# Patient Record
Sex: Male | Born: 1990 | Race: White | Hispanic: No | Marital: Single
Health system: Southern US, Community
[De-identification: ages and names within clinical notes are randomized; demographics above are authoritative.]

---
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
## 2011-01-02 LAB — CBC
HCT: 41.8 % (ref 36.0–49.0)
Hemoglobin: 13.9 g/dL (ref 12.0–16.0)
RDW: 13.7 % (ref 11.4–15.5)
WBC: 11.9 10*3/uL (ref 4.5–13.5)

## 2011-01-02 LAB — POCT I-STAT, CHEM 8
BUN: 10 mg/dL (ref 6–23)
Chloride: 105 mEq/L (ref 96–112)
Glucose, Bld: 111 mg/dL — ABNORMAL HIGH (ref 70–99)
HCT: 41 % (ref 36.0–49.0)
Potassium: 3.9 mEq/L (ref 3.5–5.1)

## 2011-01-02 LAB — DIFFERENTIAL
Basophils Absolute: 0.1 10*3/uL (ref 0.0–0.1)
Lymphocytes Relative: 22 % — ABNORMAL LOW (ref 24–48)
Lymphs Abs: 2.6 10*3/uL (ref 1.1–4.8)
Monocytes Absolute: 1.8 10*3/uL — ABNORMAL HIGH (ref 0.2–1.2)
Neutro Abs: 7.4 10*3/uL (ref 1.7–8.0)

## 2011-01-02 LAB — MONONUCLEOSIS SCREEN: Mono Screen: POSITIVE — AB

## 2011-01-02 LAB — RAPID STREP SCREEN (MED CTR MEBANE ONLY): Streptococcus, Group A Screen (Direct): NEGATIVE

## 2011-01-02 LAB — STREP A DNA PROBE: Group A Strep Probe: NEGATIVE

## 2011-01-02 LAB — POCT RAPID STREP A (OFFICE): Streptococcus, Group A Screen (Direct): NEGATIVE

## 2011-01-08 LAB — RAPID STREP SCREEN (MED CTR MEBANE ONLY): Streptococcus, Group A Screen (Direct): NEGATIVE
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
## 2022-01-20 ENCOUNTER — Emergency Department (HOSPITAL_BASED_OUTPATIENT_CLINIC_OR_DEPARTMENT_OTHER)
Admission: EM | Admit: 2022-01-20 | Discharge: 2022-01-20 | Disposition: A | Discharge status: Home / Self Care | Attending: Emergency Medicine | Admitting: Emergency Medicine

## 2022-01-20 ENCOUNTER — Emergency Department (HOSPITAL_BASED_OUTPATIENT_CLINIC_OR_DEPARTMENT_OTHER)

## 2022-01-20 ENCOUNTER — Encounter (HOSPITAL_BASED_OUTPATIENT_CLINIC_OR_DEPARTMENT_OTHER): Payer: Self-pay | Admitting: Emergency Medicine

## 2022-01-20 ENCOUNTER — Other Ambulatory Visit: Payer: Self-pay

## 2022-01-20 DIAGNOSIS — R002 Palpitations: Secondary | ICD-10-CM | POA: Diagnosis present

## 2022-01-20 DIAGNOSIS — R0682 Tachypnea, not elsewhere classified: Secondary | ICD-10-CM | POA: Diagnosis not present

## 2022-01-20 DIAGNOSIS — R55 Syncope and collapse: Secondary | ICD-10-CM | POA: Insufficient documentation

## 2022-01-20 LAB — CBC WITH DIFFERENTIAL/PLATELET
Abs Immature Granulocytes: 0.01 10*3/uL (ref 0.00–0.07)
Basophils Absolute: 0.1 10*3/uL (ref 0.0–0.1)
Basophils Relative: 1 %
Eosinophils Absolute: 0.1 10*3/uL (ref 0.0–0.5)
Eosinophils Relative: 3 %
HCT: 44.9 % (ref 39.0–52.0)
Hemoglobin: 14.7 g/dL (ref 13.0–17.0)
Immature Granulocytes: 0 %
Lymphocytes Relative: 22 %
Lymphs Abs: 1.2 10*3/uL (ref 0.7–4.0)
MCH: 29.5 pg (ref 26.0–34.0)
MCHC: 32.7 g/dL (ref 30.0–36.0)
MCV: 90.2 fL (ref 80.0–100.0)
Monocytes Absolute: 0.6 10*3/uL (ref 0.1–1.0)
Monocytes Relative: 11 %
Neutro Abs: 3.3 10*3/uL (ref 1.7–7.7)
Neutrophils Relative %: 63 %
Platelets: 250 10*3/uL (ref 150–400)
RBC: 4.98 MIL/uL (ref 4.22–5.81)
RDW: 12.9 % (ref 11.5–15.5)
WBC: 5.2 10*3/uL (ref 4.0–10.5)
nRBC: 0 % (ref 0.0–0.2)

## 2022-01-20 LAB — TSH: TSH: 1.439 u[IU]/mL (ref 0.350–4.500)

## 2022-01-20 LAB — BASIC METABOLIC PANEL
Anion gap: 7 (ref 5–15)
BUN: 14 mg/dL (ref 6–20)
CO2: 26 mmol/L (ref 22–32)
Calcium: 9.5 mg/dL (ref 8.9–10.3)
Chloride: 105 mmol/L (ref 98–111)
Creatinine, Ser: 0.86 mg/dL (ref 0.61–1.24)
GFR, Estimated: >60 mL/min (ref >60)
Glucose, Bld: 96 mg/dL (ref 70–99)
Potassium: 4.1 mmol/L (ref 3.5–5.1)
Sodium: 138 mmol/L (ref 135–145)

## 2022-01-20 LAB — T4, FREE: Free T4: 0.91 ng/dL (ref 0.61–1.12)

## 2022-01-20 LAB — MAGNESIUM: Magnesium: 2.3 mg/dL (ref 1.7–2.4)

## 2022-01-20 NOTE — ED Provider Notes (Signed)
?[facility]PT ?Provider Note ? ? ?CSN: [id number] ?Arrival date & time: 01/20/22  1151 ? ?  ? ?History ? ?Chief Complaint  ?Patient presents with  ? Palpitations  ?  Near syncope  ? ? ?Bobby Hicks is a 31 y.o. male. ? ?HPI ? ? 31 year old male presenting to the ED with palpitations, tachypnea, and near syncope.  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  The patient states that he has had episodes of near syncope and syncope previously with associated palpitations, tachypnea and lightheadedness.  During these episodes, he feels paresthesias in his left hand.  Symptoms came on today suddenly.  He denies any chest pain.  He felt mildly diaphoretic.  Symptoms have since resolved since presenting to the emergency department.  He states that he has previously had an echocardiogram through the [employer] [facility] and has had a Holter monitor placed for 7 days which got no episodes of palpitations because he did not have any while he wore the monitor. ? ?Home Medications ?Prior to Admission medications   ?Not on File  ?   ? ?Allergies    ?Patient has no known allergies.   ? ?Review of Systems   ?Review of Systems  ?Respiratory:  Positive for shortness of breath.   ?Cardiovascular:  Positive for palpitations.  ?Neurological:  Positive for light-headedness.  ?All other systems reviewed and are negative. ? ?Physical Exam ?Updated Vital Signs ?BP 139/83 (BP Location: Right Arm)   Pulse 65   Temp 97.8 ?F (36.6 ?C)   Resp (!) 22   Wt 90.7 kg   SpO2 99%  ?Physical Exam ?Vitals and nursing note reviewed.  ?Constitutional:   ?   General: He is not in acute distress. ?   Appearance: He is well-developed.  ?HENT:  ?   Head: Normocephalic and atraumatic.  ?   Mouth/Throat:  ?   Mouth: Mucous membranes are moist.  ?Eyes:  ?   Conjunctiva/sclera: Conjunctivae normal.  ?Cardiovascular:  ?    Rate and Rhythm: Normal rate and regular rhythm.  ?   Pulses: Normal pulses.  ?   Heart sounds: No murmur heard. ?Pulmonary:  ?   Effort: Pulmonary effort is normal. No respiratory distress.  ?   Breath sounds: Normal breath sounds.  ?Abdominal:  ?   Palpations: Abdomen is soft.  ?   Tenderness: There is no abdominal tenderness.  ?Musculoskeletal:     ?   General: No swelling.  ?   Cervical back: Neck supple.  ?   Right lower leg: No edema.  ?   Left lower leg: No edema.  ?Skin: ?   General: Skin is warm and dry.  ?   Capillary Refill: Capillary refill takes less than 2 seconds.  ?Neurological:  ?   General: No focal deficit present.  ?   Mental Status: He is alert and oriented to person, place, and time.  ?   Cranial Nerves: No cranial nerve deficit.  ?   Sensory: No sensory deficit.  ?   Motor: No weakness.  ?   Gait: Gait normal.  ?Psychiatric:     ?   Mood and Affect: Mood is anxious.  ? ? ?ED Results / Procedures / Treatments   ?Labs ?(all labs ordered are listed, but only abnormal results are displayed) ?Labs Reviewed  ?BASIC METABOLIC PANEL  ?MAGNESIUM  ?CBC  WITH DIFFERENTIAL/PLATELET  ?TSH  ?T4, FREE  ?CBG MONITORING, ED  ? ? ?EKG ?EKG Interpretation ? ?Date/Time:  Sunday January 20 2022 12:09:33 EDT ?Ventricular Rate:  70 ?PR Interval:  158 ?QRS Duration: 106 ?QT Interval:  404 ?QTC Calculation: 436 ?R Axis:   -27 ?Text Interpretation: Normal sinus rhythm with sinus arrhythmia Incomplete right bundle branch block Borderline ECG No previous ECGs available Confirmed by Ernie Avena ([id number]) on 01/20/2022 1:15:07 PM ? ?Radiology ?No results found. ? ?Procedures ?Procedures  ? ? ?Medications Ordered in ED ?Medications - No data to display ? ?ED Course/ Medical Decision Making/ A&P ?  ?                        ?Medical Decision Making ?Amount and/or Complexity of Data Reviewed ?Labs: ordered. ?Radiology: ordered. ? ? ? 31 year old male presenting to the ED with palpitations, tachypnea, and near syncope.  The patient  is active duty in the [employer] and has had previous episodes and symptoms associated with this worked up through the [employer].  The patient states that he has had episodes of near syncope and syncope previously with associated palpitations, tachypnea and lightheadedness.  During these episodes, he feels paresthesias in his left hand.  Symptoms came on today suddenly.  He denies any chest pain.  He felt mildly diaphoretic.  Symptoms have since resolved since presenting to the emergency department.  He states that he has previously had an echocardiogram through the [employer] health system and has had a Holter monitor placed for 7 days which got no episodes of palpitations because he did not have any while he wore the monitor. ? ?The patient denies any family history of sudden cardiac death at a young age.  No episodes of chest pain or syncope while playing sports. ? ?On arrival, the patient was afebrile, hemodynamically stable, sinus rhythm noted on cardiac telemetry, mildly hypertensive BP 145/94, saturating 100% on room air.  Blood pressures improved to 139/83 without intervention. ? ?EKG on arrival revealed normal sinus rhythm, ventricular rate 7 0, no evidence of WPW, HOCM, Brugada.  QTc normal.  No ischemic changes noted. ? ?Bobby Hicks is a 31 y.o. male who presents with presyncope as per above. ? ?Currently, Bobby Hicks is awake, alert, and hemodynamically stable.  I am most concerned for panic attack versus cardiogenic arrhythmia such as SVT. I do not think that this is due to ACS, PE, electrolyte abnormality, heart valve abnormality such as aortic stenosis, HOCM, pneumothorax, aortic dissection, ruptured AAA, anemia, hypoglycemia, seizure, stroke, hypovolemia. ? ?On exam, he has intact distal pulses in all extremities, normal capillary refill, a regular heart rate, and normal breath sounds. There is no unusual abdominal mass, or heart murmur.  As per above, there are no focal neurologic  deficits on exam and the patient can ambulate without difficultly. ? ?As per above, the ECG reveals no evidence of acute ischemia, dysrhythmia, or syncope-associated finding. The CXR was both interpreted by radiology and by myself. There is no evidence of cardiomegaly, pneumonia, pneumothorax, mediastinal widening, acute volume overload, fracture, or other acute, emergent intrathoracic pathology.   ? ?Labs reveal no acute abnormalities.  The patient has had no chest pain.  Do not think further testing with troponin or D-dimer is warranted at this time as the patient is PERC negative and has had no chest pain throughout these episodes. ? ?Chest x-ray revealed no mediastinal widening, no focal consolidation, no pneumothorax. ? ?  I explained to the patient his negative work-up at this time.  I did recommend that he follow-up outpatient with a cardiologist to discuss further longer-term cardiac monitoring.  I explained my differential which includes panic attacks/anxiety versus possible cardiac arrhythmia such as SVT.  He had no episodes of SVT over 4 hours on cardiac telemetry.  His EKG is reassuring.  I provided a referral to a cardiologist in the area.  He would potentially benefit from longer-term cardiac monitoring to try and catch an arrhythmia.  If negative, further work-up with his PCP to explore potential etiologies such as panic disorder is warranted. ? ?I believe that the patient is stable for discharge based on his reassuring evaluation. At the time of discharge, his vitals were appropriate and he was able to ambulate without difficultly. I provided ED return precautions. The patient agreed to followup outpatient as needed. ? ?Final Clinical Impression(s) / ED Diagnoses ?Final diagnoses:  ?Near syncope  ?Palpitations  ? ? ?Rx / DC Orders ?ED Discharge Orders   ? ?      Ordered  ?  Ambulatory referral to Cardiology       ? 01/20/22 1520  ? ?  ?  ? ?  ? ? ?  ?Ernie Avena, MD ?01/23/22 2315 ? ?

## 2022-01-20 NOTE — Discharge Instructions (Addendum)
Your neurologic exam today was unremarkable.  Your episodes of palpitations have so far not been caught on EKG or cardiac monitoring.  Recommend follow-up outpatient with cardiology for repeat Holter or Zio patch monitoring for longer duration of time to try and catch these episodes.  Symptoms could be due to SVT or other cardiogenic arrhythmia although your EKG today showed no abnormal intervals or other abnormal electrical conduction.  Also consideration is anxiety/panic attacks although the cardiac component needs to be further worked up outpatient.  Your chest x-ray and screening laboratory work-up was normal. ?

## 2022-01-20 NOTE — ED Triage Notes (Signed)
Recurrent left arm numbness, pt sts when this happens , he feels dizzy , hot and near syncope  ?

## 2023-06-30 IMAGING — DX DG CHEST 1V PORT
1 series · 2 of 2 positions shown · non-contrast
Comparison: Portable exam 7743 hours without priors for comparison

CLINICAL DATA: Near syncope, fell heart racing then felt like he
was going to pass out

EXAM:
PORTABLE CHEST 1 VIEW

[Series 1: chest ap · 0.14mm/px · 2 of 2 slices shown]
[im 1/2]
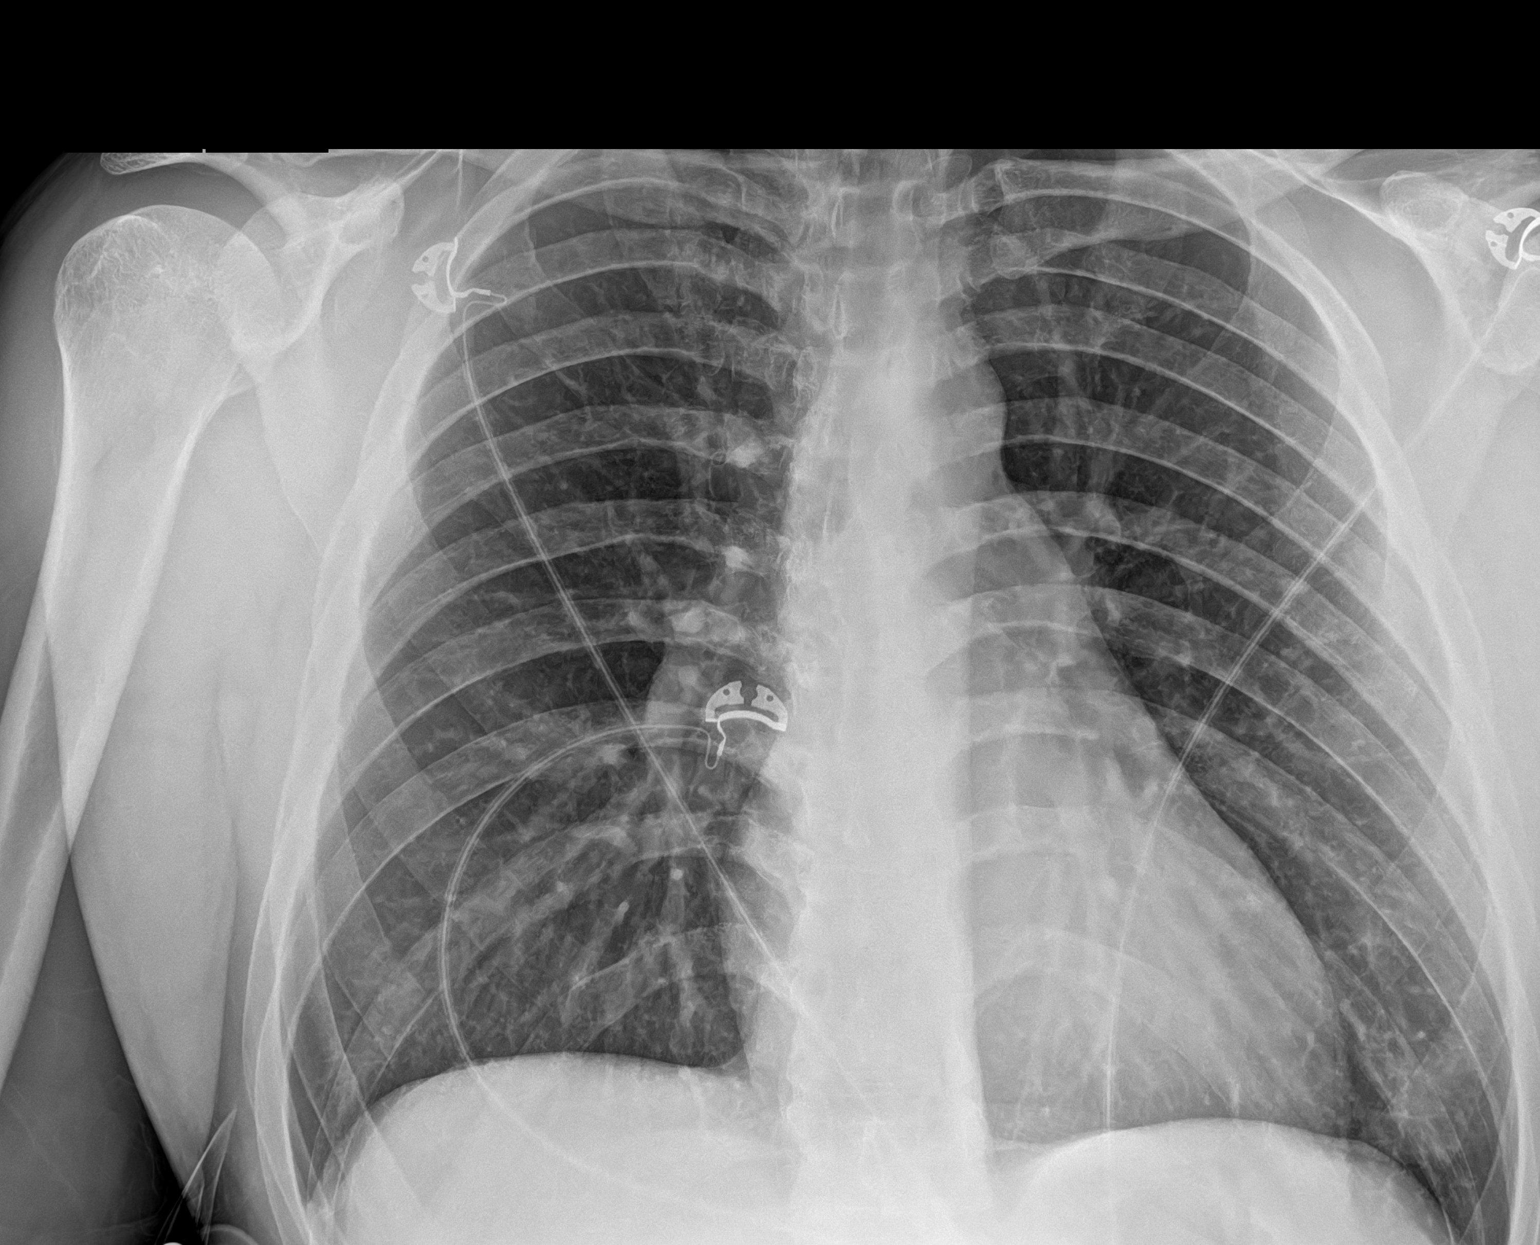
[im 2/2]
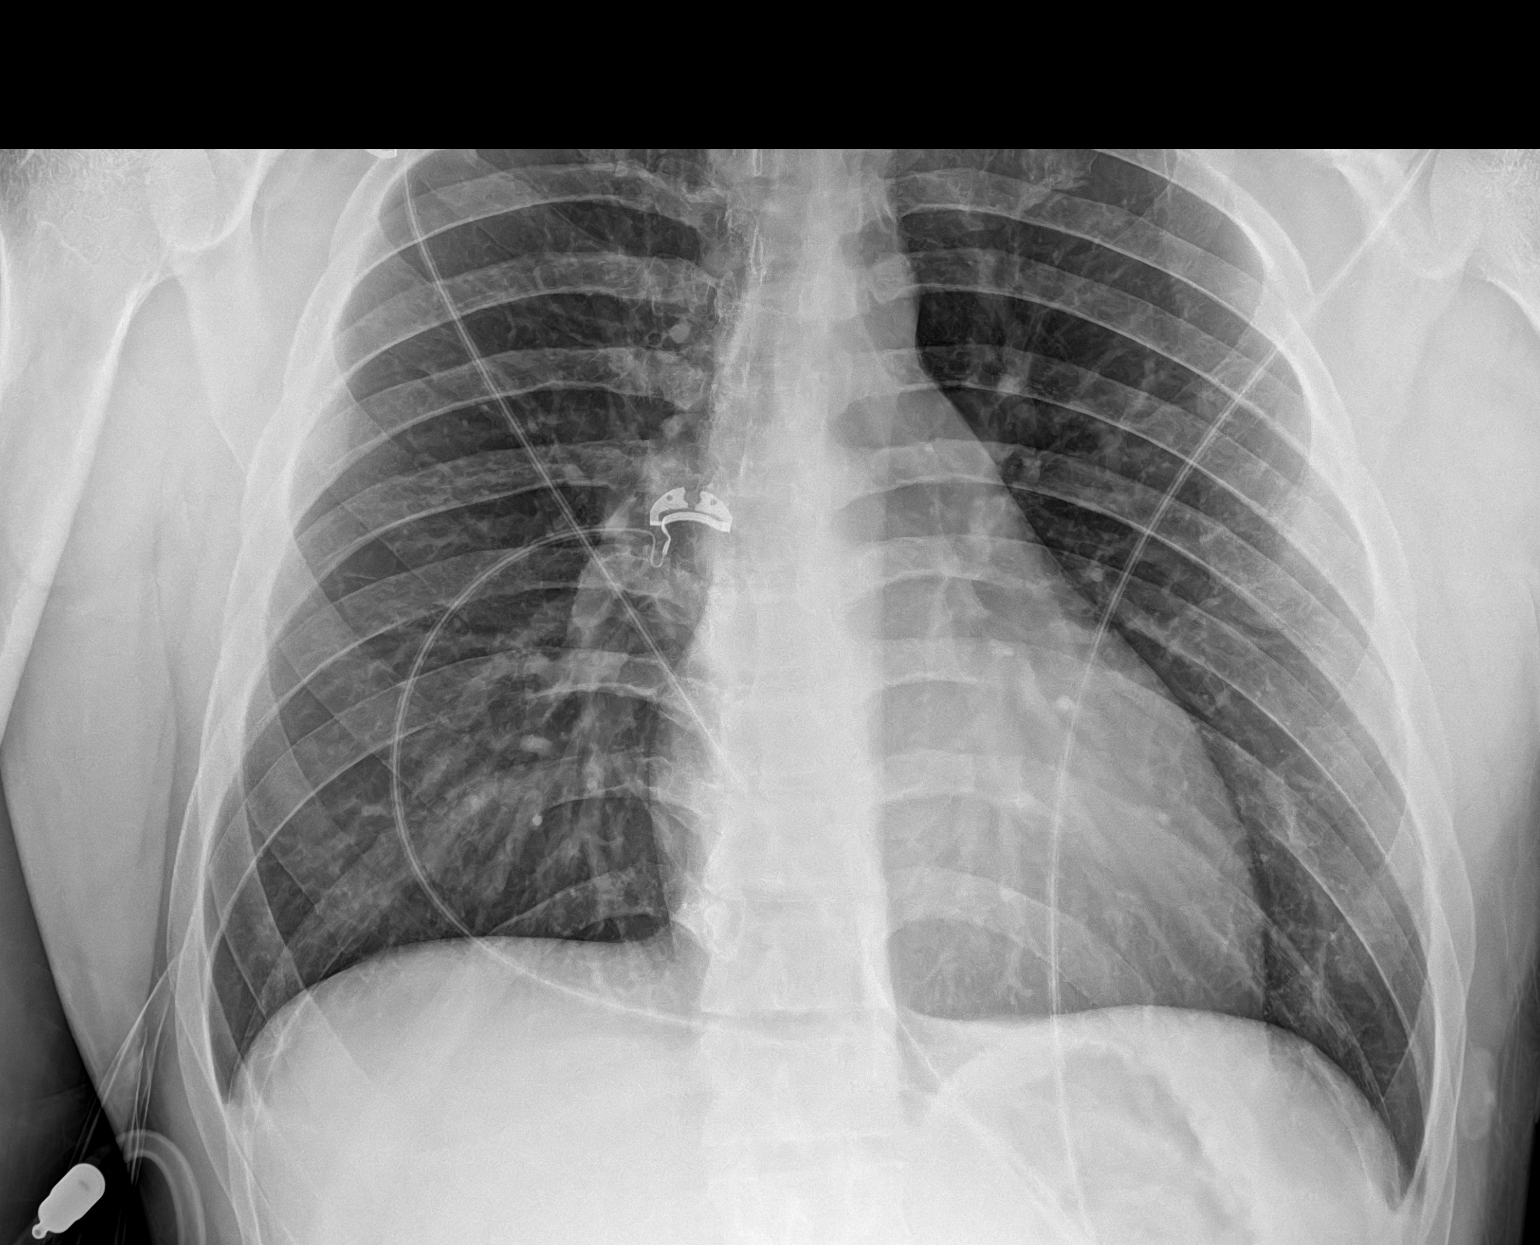

[2 of 2 positions shown; findings below may reference images not displayed]

FINDINGS: Normal heart size, mediastinal contours, and pulmonary vascularity.

Lungs clear.

No pleural effusion or pneumothorax.

Bones unremarkable.
IMPRESSION: No acute abnormalities.
